# Patient Record
Sex: Female | Born: 1950 | Race: White | Hispanic: No | Marital: Single
Health system: Southern US, Community
[De-identification: ages and names within clinical notes are randomized; demographics above are authoritative.]

---

## 2008-04-07 ENCOUNTER — Ambulatory Visit (HOSPITAL_BASED_OUTPATIENT_CLINIC_OR_DEPARTMENT_OTHER): Admission: RE | Admit: 2008-04-07 | Discharge: 2008-04-07 | Payer: Self-pay | Admitting: Family Medicine

## 2008-04-07 ENCOUNTER — Ambulatory Visit: Payer: Self-pay | Admitting: Diagnostic Radiology

## 2010-01-18 IMAGING — CR DG CHEST 2V
2 series · 2 of 2 positions shown · non-contrast
Comparison: None.

CLINICAL DATA: Cough and congestion

CHEST - 2 VIEW

[w chest lat *]
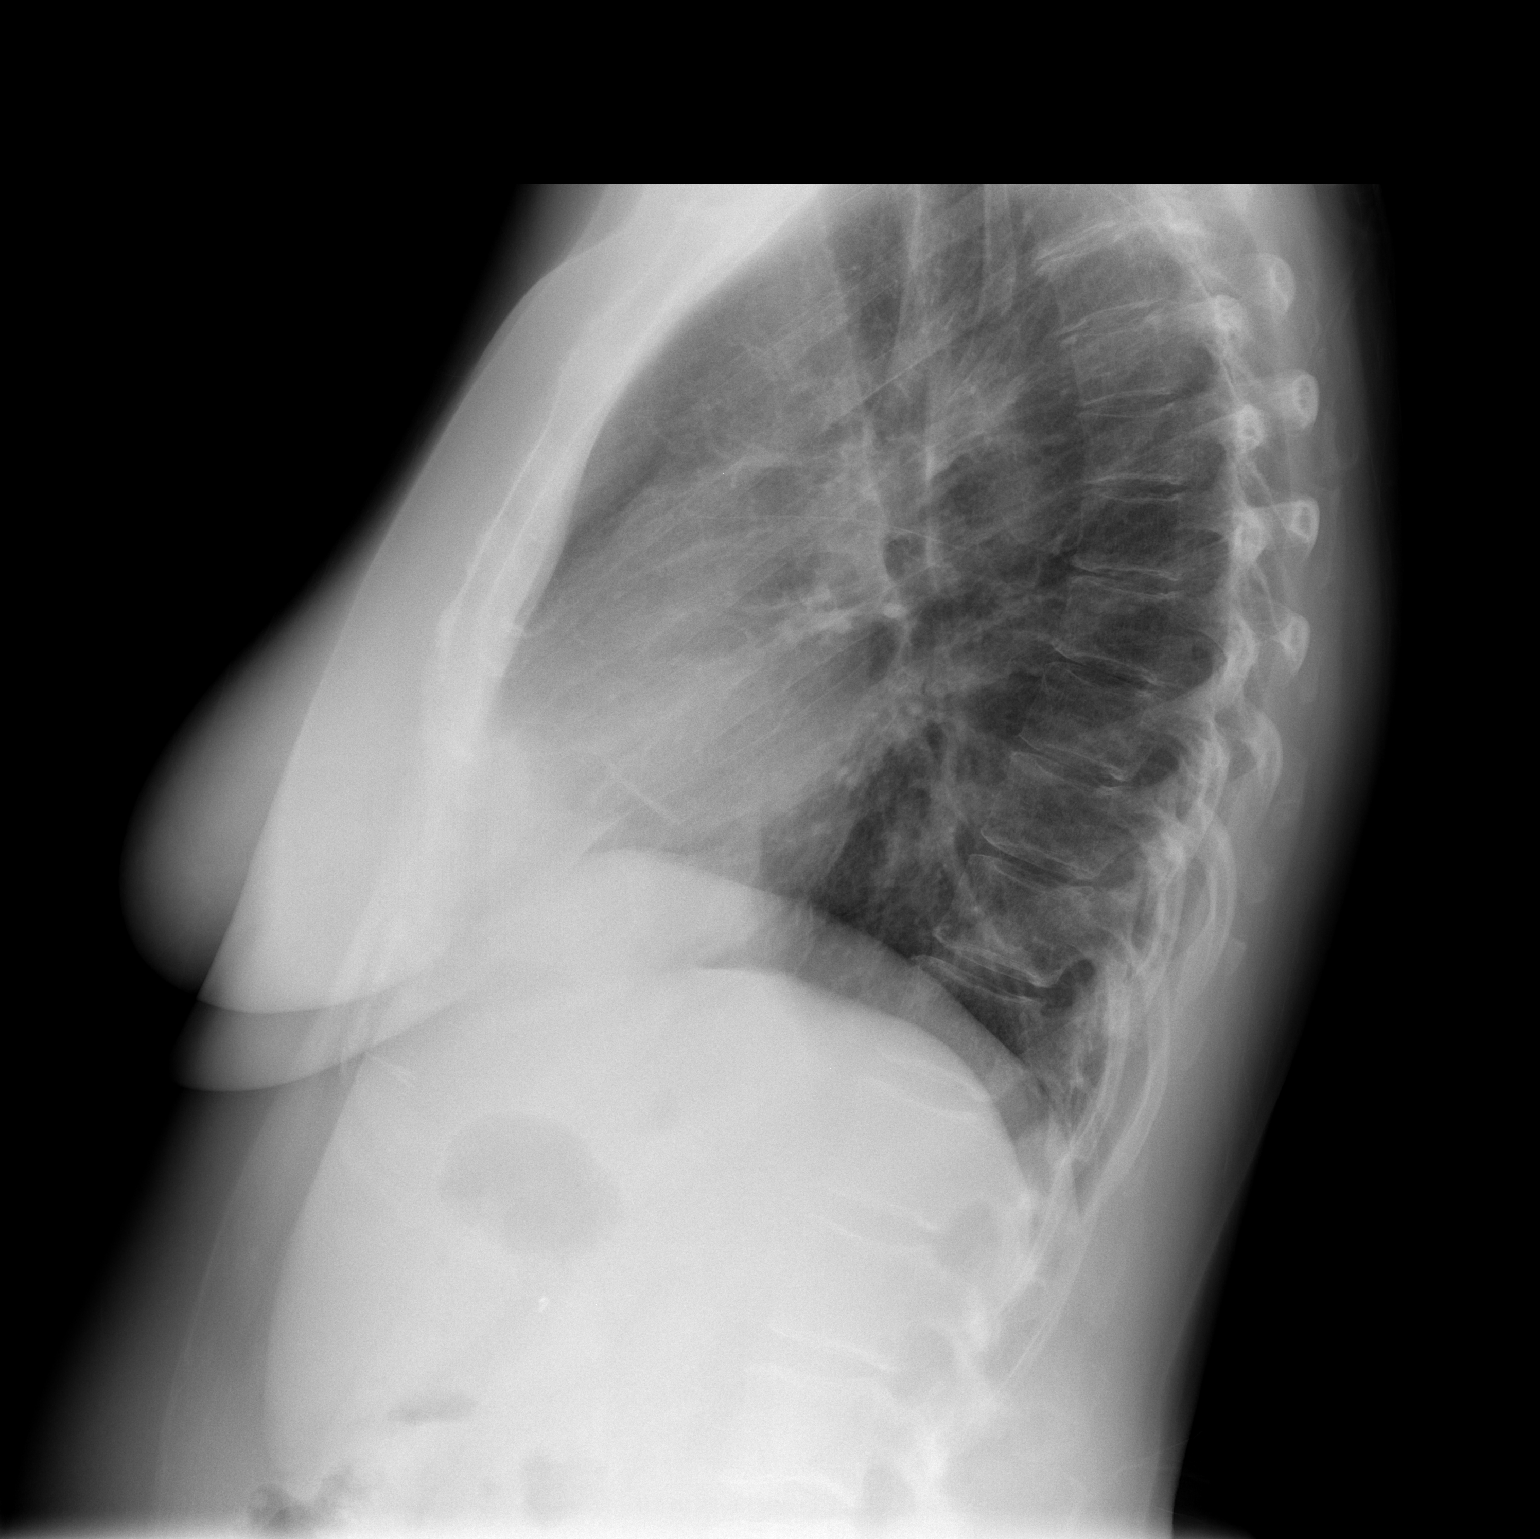

[w chest pa]
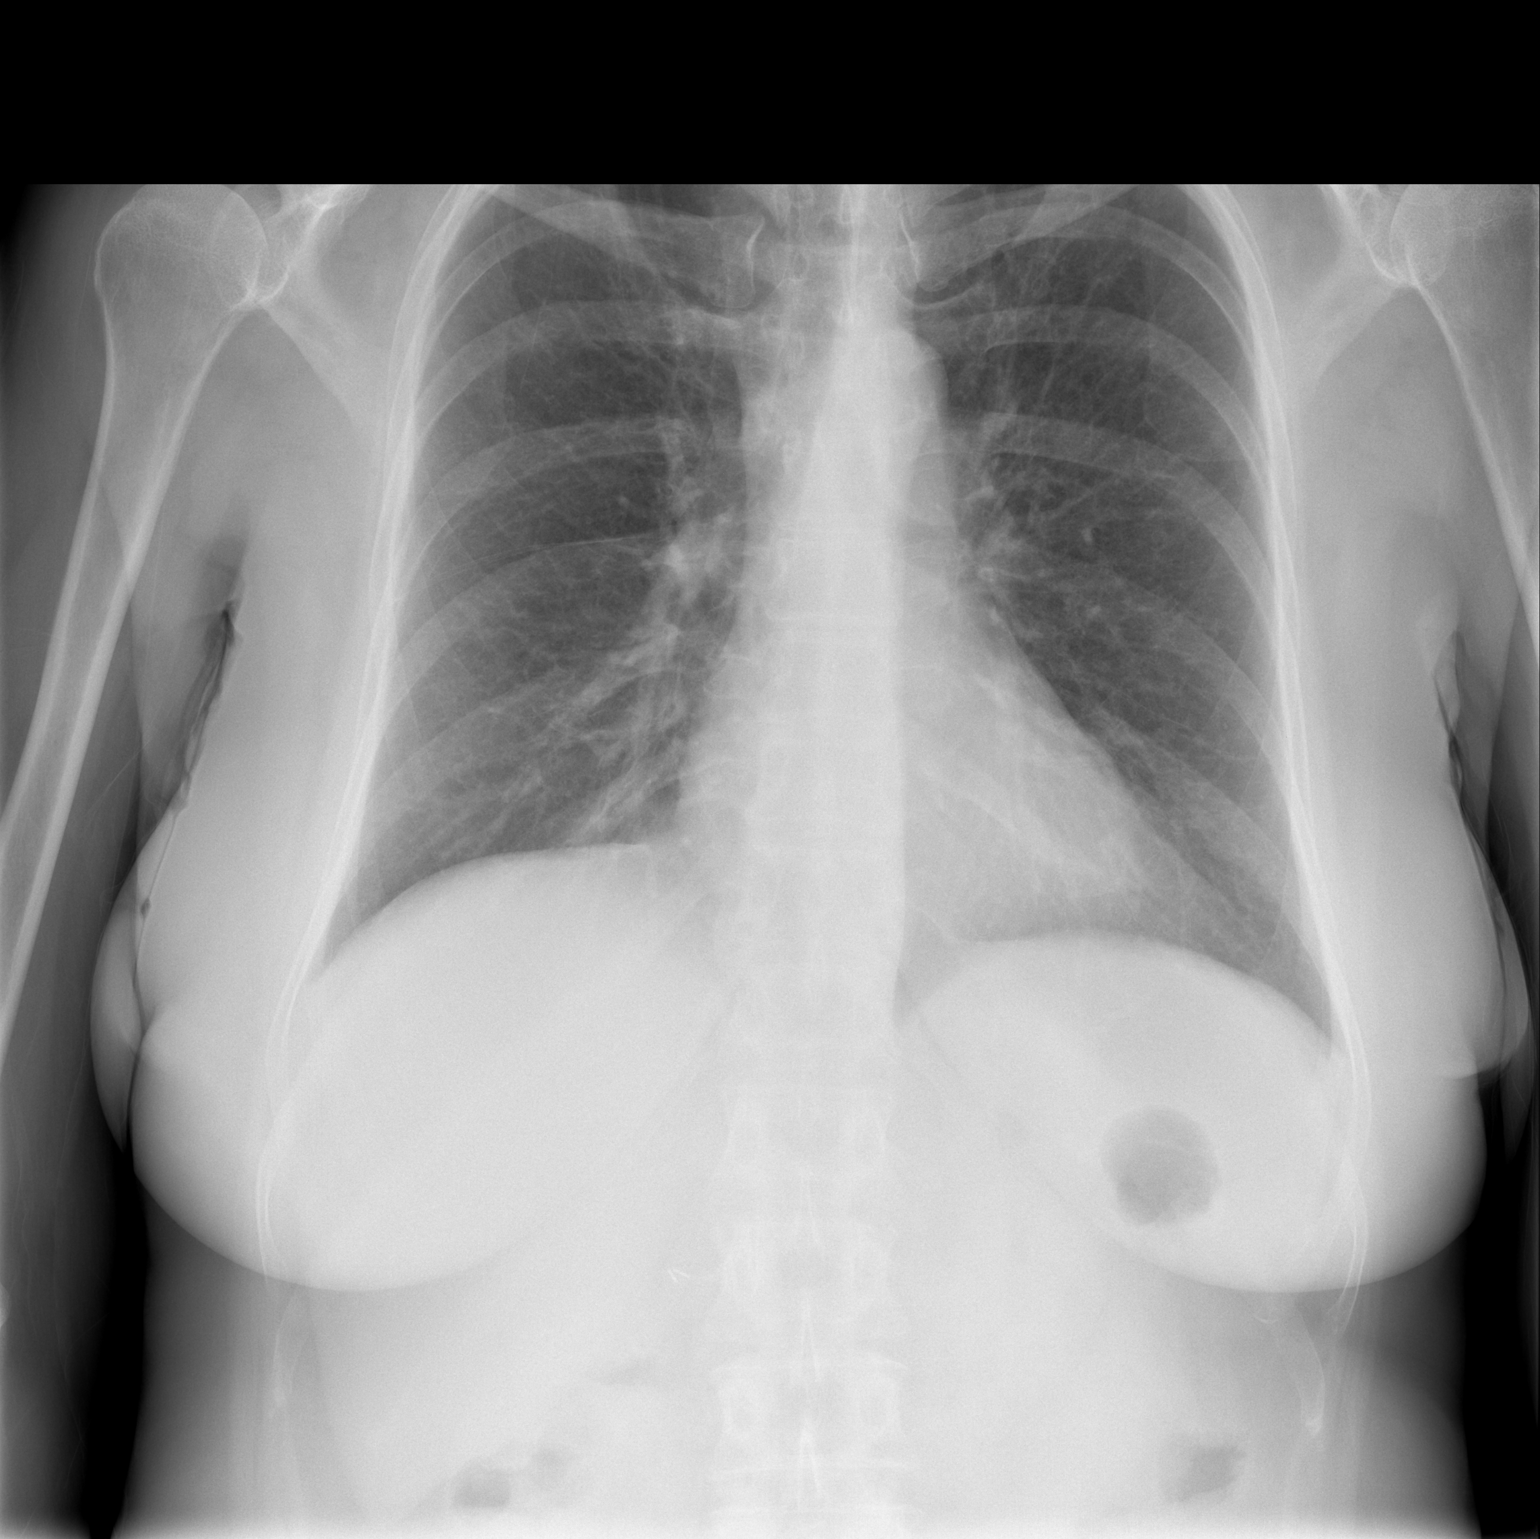

[2 of 2 positions shown; findings below may reference images not displayed]

FINDINGS: Lungs are clear.  Mild chronic interstitial coarsening is
evident. The cardiopericardial silhouette is within normal limits
for size. Imaged bony structures of the thorax are intact.
IMPRESSION: No acute cardiopulmonary process.

## 2013-07-17 ENCOUNTER — Other Ambulatory Visit (HOSPITAL_COMMUNITY): Payer: Self-pay | Admitting: Psychiatry

## 2014-03-19 NOTE — Telephone Encounter (Signed)
Cleaning out MD in basket. Refill was not appropriate and pharmacy was notified May 2015 when request was sent.

## 2021-03-19 DEATH — deceased
